# Patient Record
Sex: Male | Born: 1980 | Race: Black or African American | Hispanic: No | Marital: Single | Smoking: Never smoker
Health system: Southern US, Community
[De-identification: ages and names within clinical notes are randomized; demographics above are authoritative.]

## PROBLEM LIST (undated history)

## (undated) DIAGNOSIS — M25551 Pain in right hip: Secondary | ICD-10-CM

## (undated) DIAGNOSIS — M549 Dorsalgia, unspecified: Secondary | ICD-10-CM

## (undated) HISTORY — DX: Pain in right hip: M25.551

## (undated) HISTORY — DX: Dorsalgia, unspecified: M54.9

---

## 2013-04-10 ENCOUNTER — Ambulatory Visit: Payer: Self-pay | Admitting: Family Medicine

## 2014-05-29 ENCOUNTER — Ambulatory Visit: Payer: Self-pay | Admitting: Family Medicine

## 2014-11-09 ENCOUNTER — Encounter: Payer: Self-pay | Admitting: *Deleted

## 2014-11-09 ENCOUNTER — Ambulatory Visit
Admission: EM | Admit: 2014-11-09 | Discharge: 2014-11-09 | Disposition: A | Discharge status: Home / Self Care | Payer: BC Managed Care – PPO | Attending: Family Medicine | Admitting: Family Medicine

## 2014-11-09 DIAGNOSIS — B356 Tinea cruris: Secondary | ICD-10-CM

## 2014-11-09 MED ORDER — KETOCONAZOLE 2 % EX CREA: 1.0000 "application " | TOPICAL_CREAM | Freq: Two times a day (BID) | CUTANEOUS | Status: DC

## 2014-11-09 NOTE — ED Provider Notes (Signed)
CSN: [id number]     Arrival date & time 11/09/14  1016 History   First MD Initiated Contact with Patient 11/09/14 1105     Chief Complaint  Patient presents with  . Pruritis    jock itching x 1.5 weeks, has used OTC cream Terbindine hydrochloride 1% cream with some relief   (Consider location/radiation/quality/duration/timing/severity/associated sxs/prior Treatment) Patient is a 34 y.o. male presenting with rash. The history is provided by the patient. No language interpreter was used.  Rash Location:  Ano-genital Ano-genital rash location:  Pelvis and scrotum Quality: itchiness and redness   Severity:  Moderate Onset quality: 1.5. Timing:  Unable to specify Chronicity:  New Context comment:  Initially patient cannot figure out what would cause increased moisture in the groin area but he does want at night and usually doesn't change afterwards Total moisture in the groin area could be causing the problem Relieved by:  Anti-fungal cream Associated symptoms: no abdominal pain, no diarrhea, no sore throat, no throat swelling and no tongue swelling     History reviewed. No pertinent past medical history. History reviewed. No pertinent past surgical history. History reviewed. No pertinent family history. History  Substance Use Topics  . Smoking status: Never Smoker   . Smokeless tobacco: Not on file  . Alcohol Use: Yes     Comment: occasionally    Review of Systems  Constitutional: Negative for activity change.  HENT: Negative for sore throat.   Gastrointestinal: Negative for abdominal pain and diarrhea.  Skin: Positive for rash.  All other systems reviewed and are negative.   Allergies  Review of patient's allergies indicates no known allergies.  Home Medications   Prior to Admission medications   Medication Sig Start Date End Date Taking? Authorizing Provider  ketoconazole (NIZORAL) 2 % cream Apply 1 application topically 2 (two) times daily. 11/09/14   Hassan RowanEugene Woodward Klem, MD    BP 116/73 mmHg  Pulse 73  Temp(Src) 97.4 F (36.3 C) (Tympanic)  Resp 20  Ht 6\' 4"  (1.93 m)  Wt 165 lb 5 oz (74.985 kg)  BMI 20.13 kg/m2 Physical Exam  Constitutional: He is oriented to person, place, and time. He appears well-developed and well-nourished.  HENT:  Head: Atraumatic.  Neurological: He is alert and oriented to person, place, and time.  Skin: Rash noted. There is erythema. No pallor.     Psychiatric: His behavior is normal.    ED Course  Procedures (including critical care time) Labs Review Labs Reviewed - No data to display  Imaging Review No results found. Several things. #1 his wife was concerned though something else going on I think this is as jock itch. #2 it hasn't completely cleared and I think is because of the moisture around his genital area after he runs night that was not being resolved. Recommend showering after the run putting on fresh underwear keeping the area dry as possible and we'll switch him from the Lamisil to stronger cream like Nizoral cream.  MDM   1. Tinea cruris        Hassan RowanEugene Mikeila Burgen, MD 11/10/14 [phone]

## 2014-11-09 NOTE — Discharge Instructions (Signed)
Jock Itch Jock itch is a fungal infection of the skin in the groin area. It is sometimes called "ringworm" even though it is not caused by a worm. A fungus is a type of germ that thrives in dark, damp places.  CAUSES  This infection may spread from:  A fungus infection elsewhere on the body (such as athlete's foot).  Sharing towels or clothing. This infection is more common in:  Hot, humid climates.  People who wear tight-fitting clothing or wet bathing suits for long periods of time.  Athletes.  Overweight people.  People with diabetes. SYMPTOMS  Jock itch causes the following symptoms:  Red, pink or brown rash in the groin. Rash may spread to the thighs, anus, and buttocks.  Itching. DIAGNOSIS  Your caregiver may make the diagnosis by looking at the rash. Sometimes a skin scraping will be sent to test for fungus. Testing can be done either by looking under the microscope or by doing a culture (test to try to grow the fungus). A culture can take up to 2 weeks to come back. TREATMENT  Jock itch may be treated with:  Skin cream or ointment to kill fungus.  Medicine by mouth to kill fungus.  Skin cream or ointment to calm the itching.  Compresses or medicated powders to dry the infected skin. HOME CARE INSTRUCTIONS   Be sure to treat the rash completely. Follow your caregiver's instructions. It can take a couple of weeks to treat. If you do not treat the infection long enough, the rash can come back.  Wear loose-fitting clothing.  Men should wear cotton boxer shorts.  Women should wear cotton underwear.  Avoid hot baths.  Dry the groin area well after bathing. SEEK MEDICAL CARE IF:   Your rash is worse.  Your rash is spreading.  Your rash returns after treatment is finished.  Your rash is not gone in 4 weeks. Fungal infections are slow to respond to treatment. Some redness may remain for several weeks after the fungus is gone. SEEK IMMEDIATE MEDICAL CARE  IF:  The area becomes red, warm, tender, and swollen.  You have a fever. Document Released: 05/14/2002 Document Revised: 08/16/2011 Document Reviewed: 04/12/2008 ExitCare Patient Information 2015 [employer]. This information is not intended to replace advice given to you by your health care provider. Make sure you discuss any questions you have with your health care provider.  

## 2014-11-27 ENCOUNTER — Encounter: Payer: Self-pay | Admitting: Family Medicine

## 2014-11-27 ENCOUNTER — Ambulatory Visit (INDEPENDENT_AMBULATORY_CARE_PROVIDER_SITE_OTHER): Payer: BC Managed Care – PPO | Admitting: Family Medicine

## 2014-11-27 VITALS — BP 100/68 | HR 102 | Temp 98.9°F | Ht 76.0 in | Wt 165.8 lb

## 2014-11-27 DIAGNOSIS — G8929 Other chronic pain: Secondary | ICD-10-CM | POA: Insufficient documentation

## 2014-11-27 DIAGNOSIS — M545 Low back pain, unspecified: Secondary | ICD-10-CM | POA: Insufficient documentation

## 2014-11-27 DIAGNOSIS — Z0000 Encounter for general adult medical examination without abnormal findings: Secondary | ICD-10-CM | POA: Insufficient documentation

## 2014-11-27 DIAGNOSIS — Z23 Encounter for immunization: Secondary | ICD-10-CM | POA: Insufficient documentation

## 2014-11-27 LAB — POC HEMOCCULT BLD/STL (OFFICE/1-CARD/DIAGNOSTIC)

## 2014-11-27 NOTE — Progress Notes (Signed)
Name: Antonio Bauer   MRN: [id number]    DOB: July 11, 1980   Date:11/27/2014       Progress Note  Subjective  Chief Complaint  Chief Complaint  Patient presents with  . Annual Exam    wants phys with bloodwork    HPI  Pt. Is here for annual physical exam along wtih routine blood work.  Past Medical History  Diagnosis Date  . Back pain   . Right hip pain     History reviewed. No pertinent past surgical history.  Family History  Problem Relation Age of Onset  . Hypertension Father     History   Social History  . Marital Status: Single    Spouse Name: N/A  . Number of Children: N/A  . Years of Education: N/A   Occupational History  . Not on file.   Social History Main Topics  . Smoking status: Never Smoker   . Smokeless tobacco: Not on file  . Alcohol Use: Yes     Comment: occasionally  . Drug Use: Not on file  . Sexual Activity: Not on file   Other Topics Concern  . Not on file   Social History Narrative     Current outpatient prescriptions:  .  ketoconazole (NIZORAL) 2 % cream, Apply 1 application topically 2 (two) times daily., Disp: 60 g, Rfl: 1  No Known Allergies   Review of Systems  Constitutional: Negative.  Negative for fever, chills, weight loss and malaise/fatigue.  HENT: Negative for congestion, ear pain, nosebleeds and sore throat.   Eyes: Negative for blurred vision, double vision and pain.  Respiratory: Negative for cough, sputum production, shortness of breath and wheezing.   Cardiovascular: Negative for chest pain, palpitations, orthopnea and leg swelling.  Gastrointestinal: Positive for blood in stool (intermittent blood in stool). Negative for heartburn, abdominal pain and melena.  Genitourinary: Negative for dysuria, frequency and hematuria.  Musculoskeletal: Positive for myalgias and back pain. Negative for neck pain.  Skin: Positive for itching and rash.  Neurological: Negative for dizziness, speech change, seizures, loss of  consciousness and headaches.  Endo/Heme/Allergies: Negative for polydipsia. Does not bruise/bleed easily.  Psychiatric/Behavioral: Negative for depression. The patient is not nervous/anxious and does not have insomnia.       Objective  Filed Vitals:   11/27/14 1128  BP: 100/68  Pulse: 102  Temp: 98.9 F (37.2 C)  TempSrc: Oral  Height: 6\' 4"  (1.93 m)  Weight: 165 lb 12.8 oz (75.206 kg)  SpO2: 96%    Physical Exam  Constitutional: He is oriented to person, place, and time and well-developed, well-nourished, and in no distress.  HENT:  Head: Normocephalic and atraumatic.  Eyes: Conjunctivae are normal. Pupils are equal, round, and reactive to light.  Neck: Normal range of motion.  Cardiovascular: Normal rate and regular rhythm.   Pulmonary/Chest: Effort normal and breath sounds normal.  Abdominal: Soft. Bowel sounds are normal.  Genitourinary: Prostate normal. Rectal exam shows no external hemorrhoid, no internal hemorrhoid, no laceration and no tenderness. Guaiac negative stool. Prostate is not tender.  Musculoskeletal:       Lumbar back: He exhibits pain.       Back:  Neurological: He is alert and oriented to person, place, and time.  Skin: Skin is warm and dry.  Nursing note and vitals reviewed.      No results found for this or any previous visit (from the past 2160 hour(s)).   Assessment & Plan 1. Annual physical exam Unremarkable exam  except as noted. We will obtain labs and follow-up. - CBC w/Diff - Comprehensive metabolic panel - Lipid panel - TSH - Vitamin D (25 hydroxy) - POC Hemoccult Bld/Stl (1-Cd Office Dx) Hemoccult testing was negative in office today. If patient has complaints of rectal bleeding, he will be referred to GI for screening colonosocpy. We will obtain CBC today.  There are no diagnoses linked to this encounter.  Antonio Bauer Asad [facility] 11/27/2014 11:36 AM

## 2014-11-29 ENCOUNTER — Telehealth: Payer: Self-pay

## 2014-11-29 LAB — COMPREHENSIVE METABOLIC PANEL
ALT: 45 IU/L — AB (ref 0–44)
AST: 35 IU/L (ref 0–40)
Albumin/Globulin Ratio: 1.7 (ref 1.1–2.5)
Albumin: 4.5 g/dL (ref 3.5–5.5)
Alkaline Phosphatase: 87 IU/L (ref 39–117)
BUN/Creatinine Ratio: 16 (ref 8–19)
BUN: 20 mg/dL (ref 6–20)
Bilirubin Total: 0.2 mg/dL (ref 0.0–1.2)
CALCIUM: 9.5 mg/dL (ref 8.7–10.2)
CO2: 26 mmol/L (ref 18–29)
Chloride: 103 mmol/L (ref 97–108)
Creatinine, Ser: 1.26 mg/dL (ref 0.76–1.27)
GFR, EST AFRICAN AMERICAN: 85 mL/min/{1.73_m2} (ref >59)
GFR, EST NON AFRICAN AMERICAN: 74 mL/min/{1.73_m2} (ref >59)
GLOBULIN, TOTAL: 2.7 g/dL (ref 1.5–4.5)
Glucose: 89 mg/dL (ref 65–99)
POTASSIUM: 4.3 mmol/L (ref 3.5–5.2)
Sodium: 141 mmol/L (ref 134–144)
Total Protein: 7.2 g/dL (ref 6.0–8.5)

## 2014-11-29 LAB — CBC WITH DIFFERENTIAL/PLATELET
Basophils Absolute: 0 10*3/uL (ref 0.0–0.2)
Basos: 1 %
EOS (ABSOLUTE): 0.3 10*3/uL (ref 0.0–0.4)
Eos: 8 %
HEMATOCRIT: 42.3 % (ref 37.5–51.0)
Hemoglobin: 14.2 g/dL (ref 12.6–17.7)
IMMATURE GRANULOCYTES: 0 %
Immature Grans (Abs): 0 10*3/uL (ref 0.0–0.1)
Lymphocytes Absolute: 2 10*3/uL (ref 0.7–3.1)
Lymphs: 58 %
MCH: 30.5 pg (ref 26.6–33.0)
MCHC: 33.6 g/dL (ref 31.5–35.7)
MCV: 91 fL (ref 79–97)
MONOCYTES: 14 %
MONOS ABS: 0.5 10*3/uL (ref 0.1–0.9)
NEUTROS ABS: 0.7 10*3/uL — AB (ref 1.4–7.0)
Neutrophils: 19 %
PLATELETS: 136 10*3/uL — AB (ref 150–379)
RBC: 4.66 x10E6/uL (ref 4.14–5.80)
RDW: 14.4 % (ref 12.3–15.4)
WBC: 3.5 10*3/uL (ref 3.4–10.8)

## 2014-11-29 LAB — LIPID PANEL
CHOLESTEROL TOTAL: 167 mg/dL (ref 100–199)
Chol/HDL Ratio: 2.5 ratio units (ref 0.0–5.0)
HDL: 66 mg/dL (ref >39)
LDL CALC: 90 mg/dL (ref 0–99)
TRIGLYCERIDES: 56 mg/dL (ref 0–149)
VLDL CHOLESTEROL CAL: 11 mg/dL (ref 5–40)

## 2014-11-29 LAB — TSH: TSH: 2.17 u[IU]/mL (ref 0.450–4.500)

## 2014-11-29 LAB — VITAMIN D 25 HYDROXY (VIT D DEFICIENCY, FRACTURES): VIT D 25 HYDROXY: 39.2 ng/mL (ref 30.0–100.0)

## 2014-11-29 NOTE — Telephone Encounter (Signed)
-----   Message from Ellyn Hack, MD sent at 11/29/2014 10:45 AM EDT ----- CBC shows a below normal platelet count of 136. He should repeat his platelet count in 2 weeks. CMP is within normal limits except for mild elevation of ALT to 45. FLP is at goal with normal total cholesterol, triglycerides, HDL, and LDL cholesterol. TSH is within normal limits Vitamin D is within normal limits

## 2014-11-29 NOTE — Telephone Encounter (Signed)
Spoke with pt. Pt. Will call back in 2 weeks and have lab work done. Patient verbalized understanding and advised of all results. [facility]

## 2014-12-19 ENCOUNTER — Telehealth: Payer: Self-pay | Admitting: Family Medicine

## 2014-12-19 DIAGNOSIS — D696 Thrombocytopenia, unspecified: Secondary | ICD-10-CM

## 2014-12-19 NOTE — Telephone Encounter (Signed)
Order has been signed by Dr. Sherryll BurgerShah, I gave order to patient to take to Lab.

## 2014-12-19 NOTE — Telephone Encounter (Signed)
Recheck CBC w/Diff Per Dr. Sherryll BurgerShah

## 2014-12-20 LAB — CBC WITH DIFFERENTIAL/PLATELET
BASOS: 1 %
Basophils Absolute: 0 10*3/uL (ref 0.0–0.2)
EOS (ABSOLUTE): 0.4 10*3/uL (ref 0.0–0.4)
EOS: 11 %
HEMOGLOBIN: 14 g/dL (ref 12.6–17.7)
Hematocrit: 42.8 % (ref 37.5–51.0)
Immature Grans (Abs): 0 10*3/uL (ref 0.0–0.1)
Immature Granulocytes: 0 %
LYMPHS ABS: 1.9 10*3/uL (ref 0.7–3.1)
Lymphs: 54 %
MCH: 30.4 pg (ref 26.6–33.0)
MCHC: 32.7 g/dL (ref 31.5–35.7)
MCV: 93 fL (ref 79–97)
MONOCYTES: 10 %
MONOS ABS: 0.4 10*3/uL (ref 0.1–0.9)
Neutrophils Absolute: 0.9 10*3/uL — ABNORMAL LOW (ref 1.4–7.0)
Neutrophils: 24 %
PLATELETS: 183 10*3/uL (ref 150–379)
RBC: 4.6 x10E6/uL (ref 4.14–5.80)
RDW: 13.9 % (ref 12.3–15.4)
WBC: 3.6 10*3/uL (ref 3.4–10.8)

## 2014-12-23 ENCOUNTER — Telehealth: Payer: Self-pay | Admitting: Family Medicine

## 2014-12-23 NOTE — Telephone Encounter (Signed)
Spoke with patient confirmed test results. Patient will come by office on tomorrow 12/24/2014 to bring records proving his Tetnus shot and also his employer says HEP B is optional and not required for employment and he needs to pick up his form that he left here last week to be signed.

## 2014-12-23 NOTE — Telephone Encounter (Signed)
Pt has a follow-up question concerning his test results. States he received a call stating that his platelett was back to normal but would like to know what would cause it to drop

## 2014-12-23 NOTE — Telephone Encounter (Signed)
Spoke with patient and discussed reasons for a drop in platelet count including infections, certain medications etc. Patient's platelet count is back to normal again. He was reassured.

## 2014-12-23 NOTE — Telephone Encounter (Signed)
Patient would like to speak with Dr. Sherryll BurgerShah concerning his Platelett count.

## 2014-12-23 NOTE — Telephone Encounter (Signed)
Pt calling to check on his test results. Also he had dropped of a form to be filled out pertaining to his immunization. He would like for you to check no on HEP B and TETNUS because [facility]. He is requesting to pick the forms up today. Please return his call.

## 2015-09-01 ENCOUNTER — Ambulatory Visit
Admission: RE | Admit: 2015-09-01 | Discharge: 2015-09-01 | Disposition: A | Discharge status: Home / Self Care | Payer: BC Managed Care – PPO | Source: Ambulatory Visit | Attending: Family Medicine | Admitting: Family Medicine

## 2015-09-01 ENCOUNTER — Ambulatory Visit (INDEPENDENT_AMBULATORY_CARE_PROVIDER_SITE_OTHER): Payer: BC Managed Care – PPO | Admitting: Family Medicine

## 2015-09-01 ENCOUNTER — Encounter: Payer: Self-pay | Admitting: Family Medicine

## 2015-09-01 VITALS — BP 100/73 | HR 88 | Temp 98.7°F | Resp 16 | Ht 76.0 in | Wt 171.2 lb

## 2015-09-01 DIAGNOSIS — M25551 Pain in right hip: Principal | ICD-10-CM

## 2015-09-01 DIAGNOSIS — Z23 Encounter for immunization: Secondary | ICD-10-CM

## 2015-09-01 DIAGNOSIS — G8929 Other chronic pain: Secondary | ICD-10-CM

## 2015-09-01 MED ORDER — CELECOXIB 100 MG PO CAPS: 100.0000 mg | ORAL_CAPSULE | Freq: Two times a day (BID) | ORAL | Status: DC

## 2015-09-01 NOTE — Progress Notes (Signed)
Name: Antonio NorrieBrian Bauer   MRN: [id number]    DOB: 09/29/1980   Date:09/01/2015       Progress Note  Subjective  Chief Complaint  Chief Complaint  Patient presents with  . Acute Visit    Hip pain    HPI  Right Hip Pain: Pt. Presents with 1 year history of progressively worsening right hip pain. Hip pain is worse with sitting for prolonged period and with standing up and taking the first few steps. Pt. has history of low back pain and was on muscle relaxant last year which helped to some extent. He has a regular exercise routine for strengthening his lower back.   Past Medical History  Diagnosis Date  . Back pain   . Right hip pain     History reviewed. No pertinent past surgical history.  Family History  Problem Relation Age of Onset  . Hypertension Father     Social History   Social History  . Marital Status: Single    Spouse Name: N/A  . Number of Children: N/A  . Years of Education: N/A   Occupational History  . Teacher.    Social History Main Topics  . Smoking status: Never Smoker   . Smokeless tobacco: Not on file  . Alcohol Use: Yes     Comment: occasionally  . Drug Use: Not on file  . Sexual Activity: Not on file   Other Topics Concern  . Not on file   Social History Narrative     Current outpatient prescriptions:  .  ketoconazole (NIZORAL) 2 % cream, Apply 1 application topically 2 (two) times daily., Disp: 60 g, Rfl: 1  No Known Allergies   ROS    Objective  Filed Vitals:   09/01/15 1040  BP: 100/73  Pulse: 88  Temp: 98.7 F (37.1 C)  TempSrc: Oral  Resp: 16  Height: 6\' 4"  (1.93 m)  Weight: 171 lb 3.2 oz (77.656 kg)  SpO2: 98%    Physical Exam  Musculoskeletal:       Right hip: He exhibits no tenderness and no bony tenderness.       Legs: Nursing note and vitals reviewed.       Assessment & Plan  1. Need for immunization against influenza  - Flu Vaccine QUAD 36+ mos PF IM (Fluarix & Fluzone Quad PF)  2. Chronic right  hip pain Suspect muscular sprain versus arthritis. Obtain x-rays and will start on Celebrex. Referral to orthopedics for further assessment - DG HIP UNILAT WITH PELVIS 2-3 VIEWS RIGHT; Future - Ambulatory referral to Orthopedic Surgery - celecoxib (CELEBREX) 100 MG capsule; Take 1 capsule (100 mg total) by mouth 2 (two) times daily.  Dispense: 20 capsule; Refill: 0    Antonio Bauer Asad A. [facility] 09/01/2015 11:03 AM

## 2016-04-16 ENCOUNTER — Telehealth: Payer: Self-pay | Admitting: Family Medicine

## 2016-04-16 NOTE — Telephone Encounter (Signed)
Patient is requesting a [facility] for bronchitis [facility] [place]

## 2016-04-19 NOTE — Telephone Encounter (Signed)
Returned patient's call and left voicemail asking him to schedule appointment for evaluation for bronchitis before any medication can be prescribed

## 2016-09-29 ENCOUNTER — Encounter: Payer: Self-pay | Admitting: Family Medicine

## 2016-09-29 ENCOUNTER — Ambulatory Visit (INDEPENDENT_AMBULATORY_CARE_PROVIDER_SITE_OTHER): Payer: BC Managed Care – PPO | Admitting: Family Medicine

## 2016-09-29 DIAGNOSIS — J3089 Other allergic rhinitis: Secondary | ICD-10-CM | POA: Diagnosis not present

## 2016-09-29 DIAGNOSIS — R0982 Postnasal drip: Secondary | ICD-10-CM | POA: Insufficient documentation

## 2016-09-29 MED ORDER — AZITHROMYCIN 250 MG PO TABS: ORAL_TABLET | ORAL | 0 refills | Status: AC

## 2016-09-29 MED ORDER — MOMETASONE FUROATE 50 MCG/ACT NA SUSP: 2.0000 | Freq: Every day | NASAL | 0 refills | Status: AC

## 2016-09-29 NOTE — Progress Notes (Signed)
Name: Antonio Bauer   MRN: [id number]    DOB: 1981-06-02   Date:09/29/2016       Progress Note  Subjective  Chief Complaint  Chief Complaint  Patient presents with  . Allergies    possible bronchitis    HPI  Pt. Presents with symptoms of seasonal allergies (sneezing, runny nose, itchy and watery eyes and mucus production), these are improving but now he has developed a cough (mostly with mucus). He feels as if there is something in his throat, no fevers, chills, unusual fatigue etc.  He has taken Claritin for allergies and OTC cough drops for the cough  Past Medical History:  Diagnosis Date  . Back pain   . Right hip pain     History reviewed. No pertinent surgical history.  Family History  Problem Relation Age of Onset  . Hypertension Father     Social History   Social History  . Marital status: Single    Spouse name: N/A  . Number of children: N/A  . Years of education: N/A   Occupational History  . Teacher.    Social History Main Topics  . Smoking status: Never Smoker  . Smokeless tobacco: Never Used  . Alcohol use Yes     Comment: occasionally  . Drug use: No  . Sexual activity: Yes    Partners: Female   Other Topics Concern  . Not on file   Social History Narrative  . No narrative on file     Current Outpatient Prescriptions:  .  celecoxib (CELEBREX) 100 MG capsule, Take 1 capsule (100 mg total) by mouth 2 (two) times daily. (Patient not taking: Reported on 09/29/2016), Disp: 20 capsule, Rfl: 0 .  ketoconazole (NIZORAL) 2 % cream, Apply 1 application topically 2 (two) times daily. (Patient not taking: Reported on 09/29/2016), Disp: 60 g, Rfl: 1  No Known Allergies   Review of Systems  Constitutional: Negative for chills, fever and malaise/fatigue.  HENT: Positive for congestion. Negative for ear pain and sinus pain.   Respiratory: Positive for cough and sputum production. Negative for shortness of breath and wheezing.     Objective  Vitals:   09/29/16 1548  BP: 108/76  Pulse: (!) 114  Resp: 17  Temp: 98.4 F (36.9 C)  TempSrc: Oral  SpO2: 98%  Weight: 174 lb 4.8 oz (79.1 kg)  Height:  (1.93 m)    Physical Exam  Constitutional: He is well-developed, well-nourished, and in no distress.  HENT:  Head: Normocephalic and atraumatic.  Right Ear: Tympanic membrane, external ear and ear canal normal.  Left Ear: Tympanic membrane, external ear and ear canal normal.  Nose: Right sinus exhibits maxillary sinus tenderness. Right sinus exhibits no frontal sinus tenderness. Left sinus exhibits maxillary sinus tenderness. Left sinus exhibits no frontal sinus tenderness.  Mouth/Throat: Posterior oropharyngeal erythema present.  Nasal turbinates mildly hypertrophied, mucosa is normal  Cardiovascular: Regular rhythm, S1 normal and S2 normal.  Tachycardia present.   Pulmonary/Chest: No respiratory distress. He has no decreased breath sounds. He has no wheezes. He has no rhonchi.  Nursing note and vitals reviewed.      Assessment & Plan  1. Post-nasal drainage Likely because of allergies, advised to use Mucinex for expectoration of mucus, started on his azithromycin because of duration of symptoms. - azithromycin (ZITHROMAX) 250 MG tablet; 2 tabs po day 1, then 1 tab po q day x 4 days  Dispense: 6 each; Refill: 0  2. Environmental and seasonal allergies Advised  to only use for next 4-5 days to break the allergic immune response - mometasone (NASONEX) 50 MCG/ACT nasal spray; Place 2 sprays into the nose daily.  Dispense: 17 g; Refill: 0   [name] Asad A[facility] 09/29/2016 3:57 PM

## 2016-11-29 ENCOUNTER — Encounter: Payer: BC Managed Care – PPO | Admitting: Family Medicine

## 2017-06-06 IMAGING — CR DG HIP (WITH OR WITHOUT PELVIS) 2-3V*R*
1 series · 3 of 3 positions shown · non-contrast
Comparison: None.

CLINICAL DATA: Right hip pain for 1 year, progressively worsening.

EXAM:
DG HIP (WITH OR WITHOUT PELVIS) 2-3V RIGHT

[Series 1: view not recorded · 0.14mm/px · 3 of 3 slices shown]
[im 1/3]
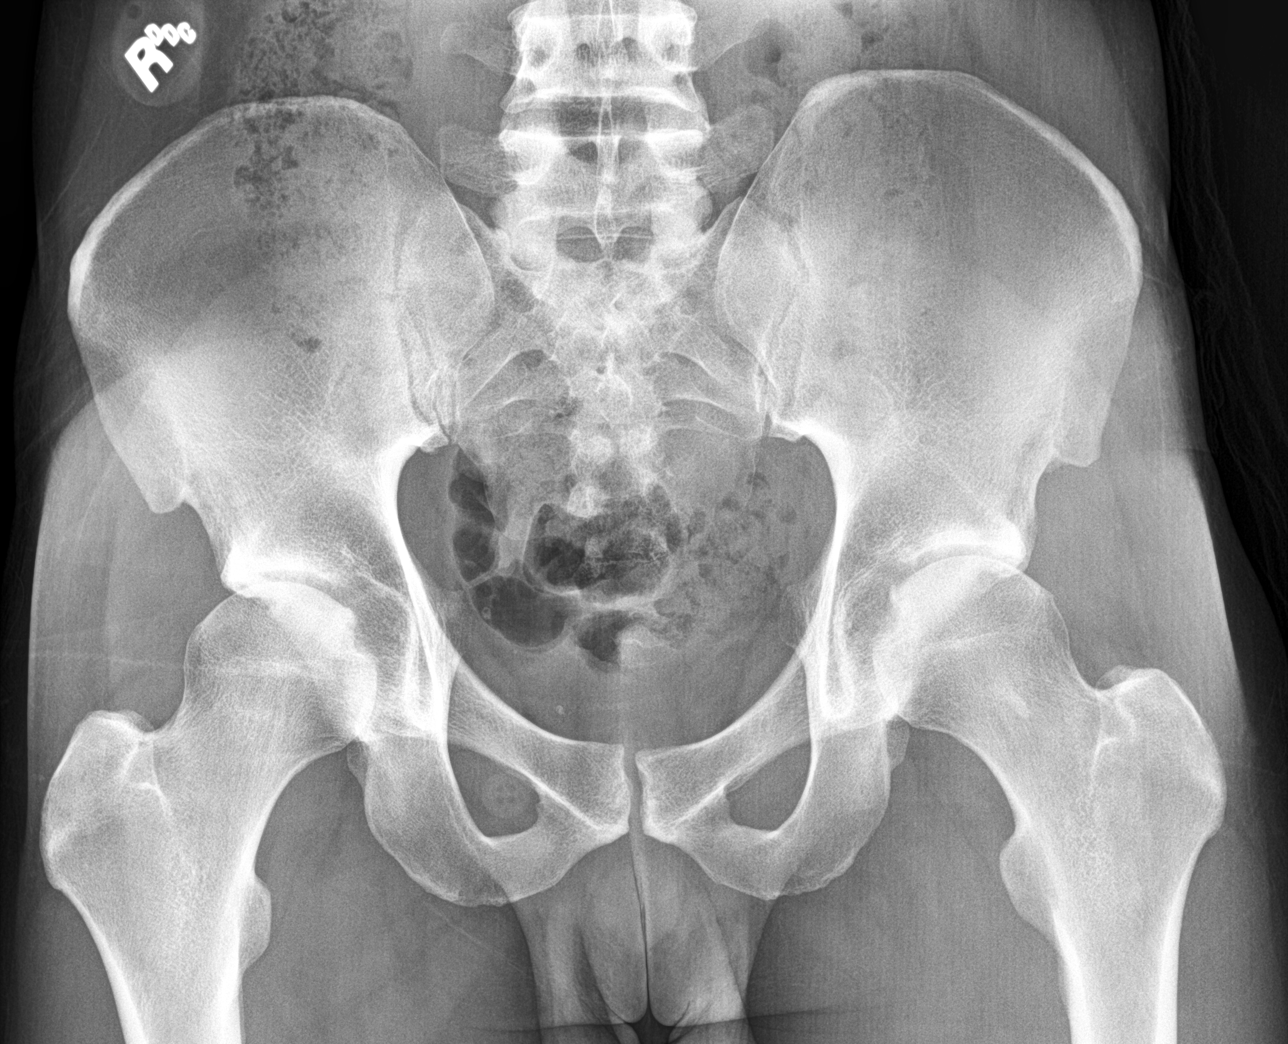
[im 2/3]
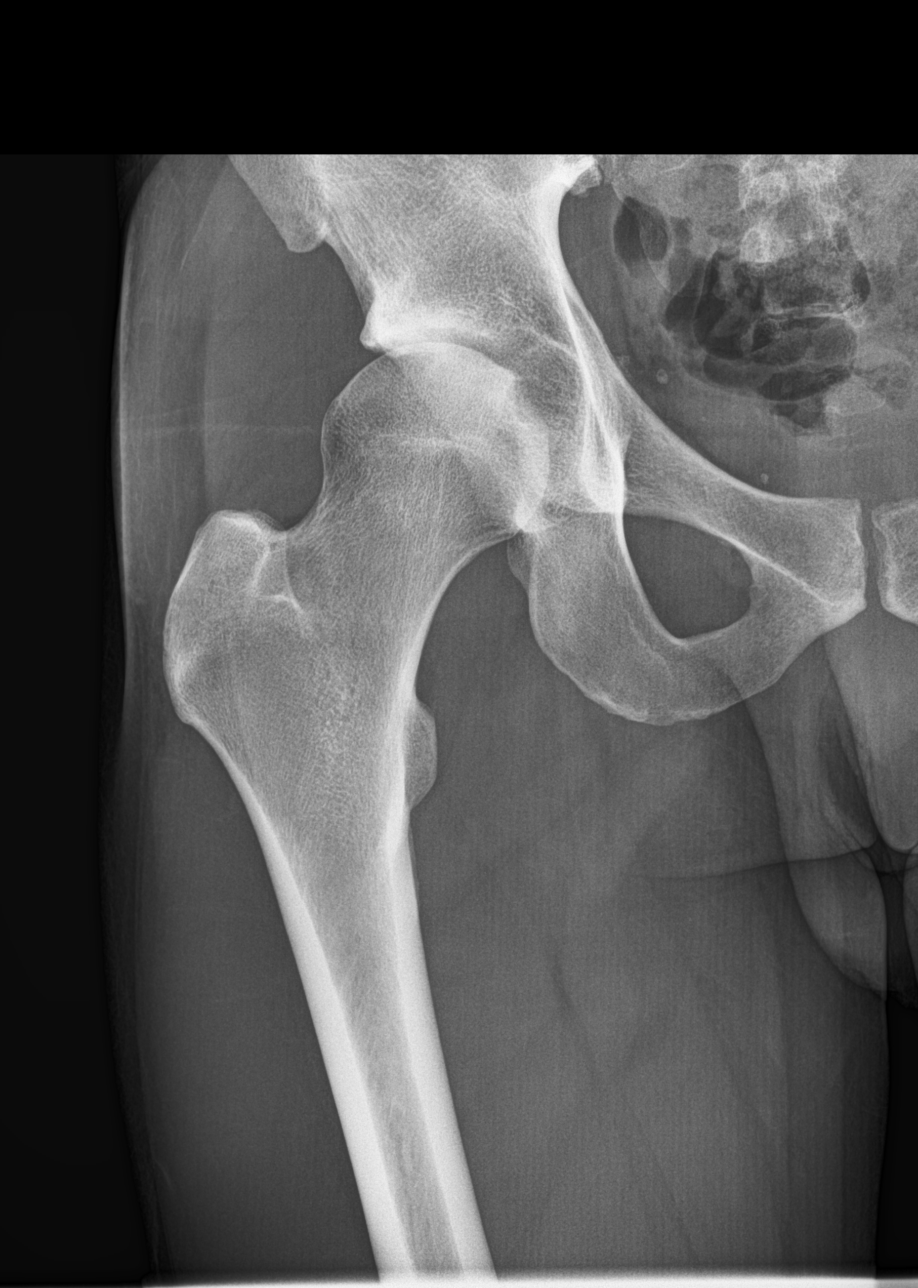
[im 3/3]
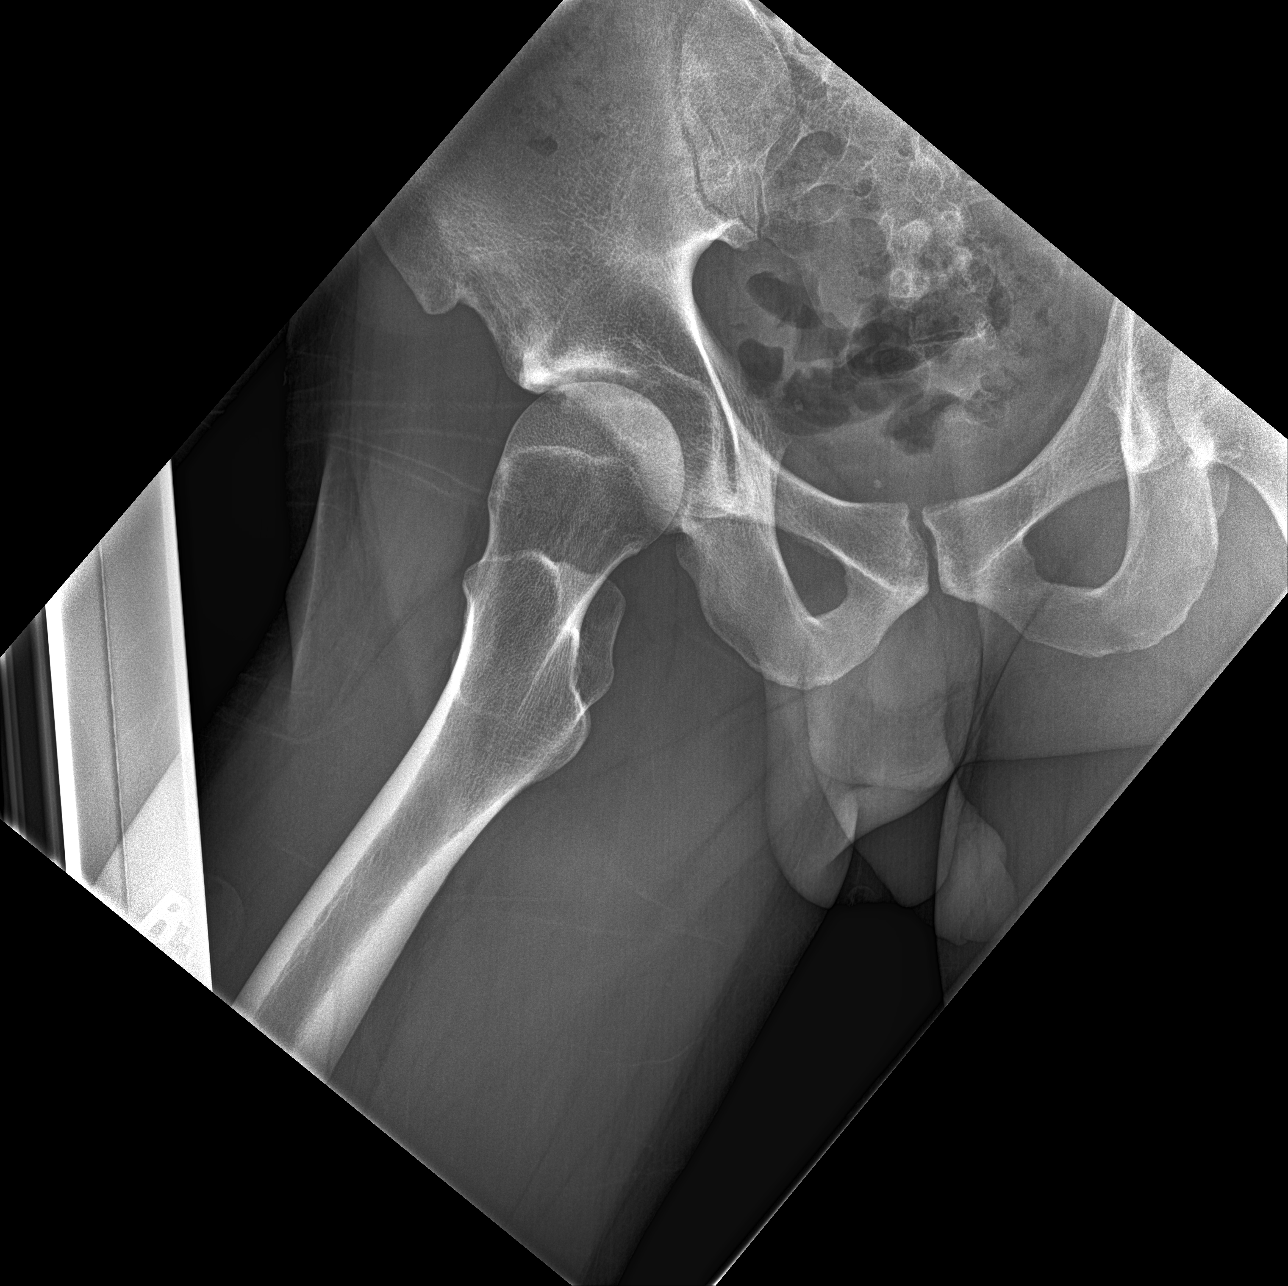

[3 of 3 positions shown; findings below may reference images not displayed]

FINDINGS: Mild joint space narrowing and sclerosis within the superior
acetabula bilaterally. No spurring. No acute bony abnormality.
Specifically, no fracture, subluxation, or dislocation. Soft tissues
are intact.
IMPRESSION: Early joint space narrowing with mild sclerosis within the superior
acetabula bilaterally compatible with early degenerative changes. No
acute findings.
